# Patient Record
Sex: Female | Born: 2000 | Race: White | Hispanic: No | Marital: Single | Smoking: Never smoker
Health system: Southern US, Community
[De-identification: ages and names within clinical notes are randomized; demographics above are authoritative.]

---
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
## 2020-09-09 ENCOUNTER — Other Ambulatory Visit: Payer: Self-pay

## 2020-09-10 ENCOUNTER — Other Ambulatory Visit: Payer: Self-pay

## 2020-09-10 ENCOUNTER — Other Ambulatory Visit: Payer: 59

## 2020-09-10 DIAGNOSIS — Z20822 Contact with and (suspected) exposure to covid-19: Secondary | ICD-10-CM

## 2020-09-12 LAB — NOVEL CORONAVIRUS, NAA: SARS-CoV-2, NAA: NOT DETECTED

## 2020-09-12 LAB — SARS-COV-2, NAA 2 DAY TAT

## 2020-09-12 LAB — SPECIMEN STATUS REPORT

## 2021-03-13 ENCOUNTER — Ambulatory Visit
Admission: EM | Admit: 2021-03-13 | Discharge: 2021-03-13 | Disposition: A | Discharge status: Home / Self Care | Payer: 59 | Attending: Physician Assistant | Admitting: Physician Assistant

## 2021-03-13 ENCOUNTER — Encounter: Payer: Self-pay | Admitting: Physician Assistant

## 2021-03-13 ENCOUNTER — Other Ambulatory Visit: Payer: Self-pay

## 2021-03-13 DIAGNOSIS — M25559 Pain in unspecified hip: Secondary | ICD-10-CM | POA: Diagnosis not present

## 2021-03-13 DIAGNOSIS — N946 Dysmenorrhea, unspecified: Secondary | ICD-10-CM | POA: Insufficient documentation

## 2021-03-13 LAB — POCT URINALYSIS DIP (MANUAL ENTRY)
Glucose, UA: NEGATIVE mg/dL
Nitrite, UA: POSITIVE — AB
Protein Ur, POC: 100 mg/dL — AB
Spec Grav, UA: 1.02 (ref 1.010–1.025)
Urobilinogen, UA: 1 E.U./dL
pH, UA: 7 (ref 5.0–8.0)

## 2021-03-13 LAB — POCT URINE PREGNANCY: Preg Test, Ur: NEGATIVE

## 2021-03-13 NOTE — ED Triage Notes (Addendum)
Lower abd cramping that started this morning.  Pt does report she is on her menstrual cycle but has never experience this type of pain in the past.  Denies burning or urinary frequency.

## 2021-03-13 NOTE — ED Provider Notes (Signed)
[facility]    CSN: [id number] Arrival date & time: 03/13/21  1026      History   Chief Complaint Chief Complaint  Patient presents with  . Abdominal Pain    HPI Virginia Brady is a 20 y.o. female.   Presents with abdominal cramping. She is on her menses and does have a history of cramping. She reports this feels the same just more intense. She denies any dysuria, fraquency or polyuria. No N, V, D and no fevers. No vaginal discharge is noted.      History reviewed. No pertinent past medical history.  There are no problems to display for this patient.   History reviewed. No pertinent surgical history.  OB History   No obstetric history on file.      Home Medications    Prior to Admission medications   Not on File    Family History History reviewed. No pertinent family history.  Social History Social History   Tobacco Use  . Smoking status: Never Smoker  . Smokeless tobacco: Never Used  Substance Use Topics  . Alcohol use: Never  . Drug use: Never     Allergies   Sulfa antibiotics   Review of Systems Review of Systems  Constitutional: Negative for fever.  Gastrointestinal: Negative for abdominal distention, abdominal pain, constipation, diarrhea, nausea, rectal pain and vomiting.  Genitourinary: Positive for pelvic pain. Negative for decreased urine volume, difficulty urinating, dysuria, flank pain, frequency, genital sores, menstrual problem, urgency, vaginal discharge and vaginal pain.  Musculoskeletal: Negative for back pain.  Skin: Negative.   All other systems reviewed and are negative.    Physical Exam Triage Vital Signs ED Triage Vitals  Enc Vitals Group     BP 03/13/21 1040 122/84     Pulse Rate 03/13/21 1040 94     Resp 03/13/21 1040 16     Temp 03/13/21 1040 98.6 F (37 C)     Temp Source 03/13/21 1040 Oral     SpO2 03/13/21 1040 97 %     Weight 03/13/21 1046 196 lb (88.9 kg)     Height 03/13/21 1046 5\' 5"   (1.651 m)     Head Circumference --      Peak Flow --      Pain Score 03/13/21 1046 4     Pain Loc --      Pain Edu? --      Excl. in GC? --    No data found.  Updated Vital Signs BP 122/84   Pulse 94   Temp 98.6 F (37 C) (Oral)   Resp 16   Ht 5\' 5"  (1.651 m)   Wt 196 lb (88.9 kg)   LMP 03/12/2021   SpO2 97%   BMI 32.62 kg/m   Visual Acuity Right Eye Distance:   Left Eye Distance:   Bilateral Distance:    Right Eye Near:   Left Eye Near:    Bilateral Near:     Physical Exam Vitals and nursing note reviewed.  Constitutional:      Appearance: She is well-developed and normal weight.  Abdominal:     General: Abdomen is flat. Bowel sounds are normal. There is no distension or abdominal bruit.     Palpations: Abdomen is soft.     Tenderness: There is no right CVA tenderness, left CVA tenderness, guarding or rebound.     Comments: Mild pelvic midline pain to palpation, no rebound or gaurding, no masses  Skin:  General: Skin is warm and dry.  Neurological:     General: No focal deficit present.     Mental Status: She is alert.  Psychiatric:        Mood and Affect: Mood normal.        Behavior: Behavior normal.      UC Treatments / Results  Labs (all labs ordered are listed, but only abnormal results are displayed) Labs Reviewed  POCT URINALYSIS DIP (MANUAL ENTRY) - Abnormal; Notable for the following components:      Result Value   Color, UA red (*)    Clarity, UA hazy (*)    Bilirubin, UA small (*)    Ketones, POC UA trace (5) (*)    Blood, UA large (*)    Protein Ur, POC =100 (*)    Nitrite, UA Positive (*)    Leukocytes, UA Trace (*)    All other components within normal limits  URINE CULTURE  POCT URINE PREGNANCY    EKG   Radiology No results found.  Procedures Procedures (including critical care time)  Medications Ordered in UC Medications - No data to display  Initial Impression / Assessment and Plan / UC Course  I have reviewed  the triage vital signs and the nursing notes.  Pertinent labs & imaging results that were available during my care of the patient were reviewed by me and considered in my medical decision making (see chart for details).     Appears to be consistent with menstrual cramping, benign abdomen and no acute findings. Mild leuks in urine without urinary symptoms, will culture for completeness.  Final Clinical Impressions(s) / UC Diagnoses   Final diagnoses:  Menstrual cramps  Pain in joint involving pelvic region and thigh, unspecified laterality     Discharge Instructions     I think this is probably just worsening menstrual cramps, which can be very painful, or could be an ovarian cyst as well. Treat with Ibuprofen 800 mg every 8 hours if needed, and a warm electric blanket can help as well. If worsens then f/u in the ED for imaging.    ED Prescriptions    None     PDMP not reviewed this encounter.   Riki Sheer, [place] 03/13/21 1132

## 2021-03-13 NOTE — Discharge Instructions (Addendum)
I think this is probably just worsening menstrual cramps, which can be very painful, or could be an ovarian cyst as well. Treat with Ibuprofen 800 mg every 8 hours if needed, and a warm electric blanket can help as well. If worsens then f/u in the ED for imaging.

## 2021-03-14 LAB — URINE CULTURE: Culture: <10000 — AB

## 2021-08-26 ENCOUNTER — Ambulatory Visit
Admission: EM | Admit: 2021-08-26 | Discharge: 2021-08-26 | Disposition: A | Discharge status: Home / Self Care | Payer: 59

## 2021-08-26 ENCOUNTER — Other Ambulatory Visit: Payer: Self-pay

## 2021-08-26 ENCOUNTER — Encounter: Payer: Self-pay | Admitting: Emergency Medicine

## 2021-08-26 DIAGNOSIS — J069 Acute upper respiratory infection, unspecified: Secondary | ICD-10-CM

## 2021-08-26 DIAGNOSIS — Z20822 Contact with and (suspected) exposure to covid-19: Secondary | ICD-10-CM

## 2021-08-26 MED ORDER — ALBUTEROL SULFATE HFA 108 (90 BASE) MCG/ACT IN AERS
2.0000 | INHALATION_SPRAY | Freq: Once | RESPIRATORY_TRACT | Status: AC
  Administered 2021-08-26: 2 via RESPIRATORY_TRACT

## 2021-08-26 MED ORDER — DEXAMETHASONE SODIUM PHOSPHATE 10 MG/ML IJ SOLN
10.0000 mg | Freq: Once | INTRAMUSCULAR | Status: AC
  Administered 2021-08-26: 10 mg via INTRAMUSCULAR

## 2021-08-26 MED ORDER — PREDNISONE 10 MG (21) PO TBPK: ORAL_TABLET | Freq: Every day | ORAL | 0 refills | Status: AC

## 2021-08-26 NOTE — ED Provider Notes (Signed)
[facility]   [id number] 08/26/21 Arrival Time: 1710   CC: COVID symptoms  SUBJECTIVE: History from: patient.  Virginia Brady is a 20 y.o. female who presents with fever, tmax of 100, headache, congestion, sore throat, cough, chest tightness x 3 days.  Denies sick exposure to COVID, flu or strep.  Hx significant for asthma.  Has tried OTC medications  without relief.  Denies aggravating factors.  Denies previous symptoms in the past.   Denies chest pain, nausea, changes in bowel or bladder habits.    ROS: As per HPI.  All other pertinent ROS negative.     History reviewed. No pertinent past medical history. History reviewed. No pertinent surgical history. Allergies  Allergen Reactions   Sulfa Antibiotics Hives   No current facility-administered medications on file prior to encounter.   Current Outpatient Medications on File Prior to Encounter  Medication Sig Dispense Refill   FLUoxetine (PROZAC) 20 MG tablet Take 20 mg by mouth daily.     Social History   Socioeconomic History   Marital status: Single    Spouse name: Not on file   Number of children: Not on file   Years of education: Not on file   Highest education level: Not on file  Occupational History   Not on file  Tobacco Use   Smoking status: Never   Smokeless tobacco: Never  Substance and Sexual Activity   Alcohol use: Never   Drug use: Never   Sexual activity: Not on file  Other Topics Concern   Not on file  Social History Narrative   Not on file   Social Determinants of Health   Financial Resource Strain: Not on file  Food Insecurity: Not on file  Transportation Needs: Not on file  Physical Activity: Not on file  Stress: Not on file  Social Connections: Not on file  Intimate Partner Violence: Not on file   History reviewed. No pertinent family history.  OBJECTIVE:  Vitals:   08/26/21 1721  BP: 124/80  Pulse: (!) 119  Resp: 16  Temp: 98.7 F (37.1 C)  TempSrc: Oral  SpO2: 92%      General appearance: alert; appears fatigued, but nontoxic; appears uncomfortable , but speaking in full sentences and tolerating own secretions HEENT: NCAT; Ears: EACs clear, TMs pearly gray; Eyes: PERRL.  EOM grossly intact. Nose: nares patent mild clear rhinorrhea, Throat: oropharynx clear, tonsils non erythematous or enlarged, uvula midline  Neck: supple without LAD Lungs: unlabored respirations, symmetrical air entry; cough: mild; no respiratory distress; wheezes and rhonchi throughout bilateral lung fields Heart: tachycardia Skin: warm and dry Psychological: alert and cooperative; normal mood and affect  ASSESSMENT & PLAN:  1. Exposure to COVID-19 virus   2. Viral URI with cough     Meds ordered this encounter  Medications   dexamethasone (DECADRON) injection 10 mg   albuterol (VENTOLIN HFA) 108 (90 Base) MCG/ACT inhaler 2 puff   COVID testing ordered.  It will take between 5-7 days for test results.  Someone will contact you regarding abnormal results.    In the meantime: You should remain isolated in your home for 10 days from symptom onset AND greater than 72 hours after symptoms resolution (absence of fever without the use of fever-reducing medication and improvement in respiratory symptoms), whichever is longer Get plenty of rest and push fluids Steroid shot given in office Albuterol inhaler given in office.  Use as needed for shortness of breath and or wheezing Prednisone prescribed Use  OTC medications like ibuprofen or tylenol as needed fever or pain Call or go to the ED if you have any new or worsening symptoms such as fever, worsening cough, shortness of breath, chest tightness, chest pain, turning blue, changes in mental status, etc...   Reviewed expectations re: course of current medical issues. Questions answered. Outlined signs and symptoms indicating need for more acute intervention. Patient verbalized understanding. After Visit Summary  given.          Rennis Harding, PA-C 08/26/21 1736

## 2021-08-26 NOTE — Discharge Instructions (Signed)
COVID testing ordered.  It will take between 5-7 days for test results.  Someone will contact you regarding abnormal results.    In the meantime: You should remain isolated in your home for 10 days from symptom onset AND greater than 72 hours after symptoms resolution (absence of fever without the use of fever-reducing medication and improvement in respiratory symptoms), whichever is longer Get plenty of rest and push fluids Steroid shot given in office Albuterol inhaler given in office.  Use as needed for shortness of breath and or wheezing Prednisone prescribed Use OTC medications like ibuprofen or tylenol as needed fever or pain Call or go to the ED if you have any new or worsening symptoms such as fever, worsening cough, shortness of breath, chest tightness, chest pain, turning blue, changes in mental status, etc..Marland Kitchen

## 2021-08-26 NOTE — ED Triage Notes (Signed)
SOB, nasal congestion, headache x 3 days.  Fever off and on, sore throat and cough

## 2021-08-27 LAB — NOVEL CORONAVIRUS, NAA: SARS-CoV-2, NAA: NOT DETECTED

## 2021-08-27 LAB — SARS-COV-2, NAA 2 DAY TAT
